# Patient Record
Sex: Female | Born: 1965 | Race: White | Hispanic: No | Marital: Married | Smoking: Never smoker
Health system: Southern US, Community
[De-identification: ages and names within clinical notes are randomized; demographics above are authoritative.]

## PROBLEM LIST (undated history)

## (undated) DIAGNOSIS — K219 Gastro-esophageal reflux disease without esophagitis: Secondary | ICD-10-CM

## (undated) DIAGNOSIS — M1990 Unspecified osteoarthritis, unspecified site: Secondary | ICD-10-CM

## (undated) HISTORY — PX: WISDOM TOOTH EXTRACTION: SHX21

## (undated) HISTORY — PX: OTHER SURGICAL HISTORY: SHX169

---

## 2000-11-02 ENCOUNTER — Other Ambulatory Visit: Admission: RE | Admit: 2000-11-02 | Discharge: 2000-11-02 | Payer: Self-pay | Admitting: Obstetrics and Gynecology

## 2001-03-01 ENCOUNTER — Encounter: Admission: RE | Admit: 2001-03-01 | Discharge: 2001-03-01 | Payer: Self-pay | Admitting: Obstetrics and Gynecology

## 2001-05-06 ENCOUNTER — Inpatient Hospital Stay (HOSPITAL_COMMUNITY): Admission: AD | Admit: 2001-05-06 | Discharge: 2001-05-08 | Payer: Self-pay | Admitting: Obstetrics and Gynecology

## 2001-05-10 ENCOUNTER — Encounter: Admission: RE | Admit: 2001-05-10 | Discharge: 2001-06-09 | Payer: Self-pay | Admitting: Obstetrics and Gynecology

## 2001-06-07 ENCOUNTER — Other Ambulatory Visit: Admission: RE | Admit: 2001-06-07 | Discharge: 2001-06-07 | Payer: Self-pay | Admitting: Obstetrics and Gynecology

## 2001-07-10 ENCOUNTER — Encounter: Admission: RE | Admit: 2001-07-10 | Discharge: 2001-08-09 | Payer: Self-pay | Admitting: Obstetrics and Gynecology

## 2001-09-09 ENCOUNTER — Encounter: Admission: RE | Admit: 2001-09-09 | Discharge: 2001-10-09 | Payer: Self-pay | Admitting: Obstetrics and Gynecology

## 2001-10-10 ENCOUNTER — Encounter: Admission: RE | Admit: 2001-10-10 | Discharge: 2001-11-09 | Payer: Self-pay | Admitting: Obstetrics and Gynecology

## 2018-01-08 ENCOUNTER — Other Ambulatory Visit: Payer: Self-pay

## 2018-01-08 ENCOUNTER — Emergency Department (HOSPITAL_COMMUNITY)
Admission: EM | Admit: 2018-01-08 | Discharge: 2018-01-08 | Disposition: A | Discharge status: Home / Self Care | Payer: 59 | Attending: Emergency Medicine | Admitting: Emergency Medicine

## 2018-01-08 ENCOUNTER — Other Ambulatory Visit: Payer: Self-pay | Admitting: Physician Assistant

## 2018-01-08 ENCOUNTER — Encounter (HOSPITAL_COMMUNITY): Payer: Self-pay

## 2018-01-08 ENCOUNTER — Emergency Department (HOSPITAL_COMMUNITY): Payer: 59

## 2018-01-08 DIAGNOSIS — R0789 Other chest pain: Secondary | ICD-10-CM | POA: Insufficient documentation

## 2018-01-08 DIAGNOSIS — R079 Chest pain, unspecified: Secondary | ICD-10-CM

## 2018-01-08 DIAGNOSIS — R55 Syncope and collapse: Secondary | ICD-10-CM | POA: Insufficient documentation

## 2018-01-08 LAB — CBC
HCT: 42.1 % (ref 36.0–46.0)
Hemoglobin: 13.5 g/dL (ref 12.0–15.0)
MCH: 29.2 pg (ref 26.0–34.0)
MCHC: 32.1 g/dL (ref 30.0–36.0)
MCV: 90.9 fL (ref 80.0–100.0)
Platelets: 226 10*3/uL (ref 150–400)
RBC: 4.63 MIL/uL (ref 3.87–5.11)
RDW: 13.2 % (ref 11.5–15.5)
WBC: 7.2 10*3/uL (ref 4.0–10.5)
nRBC: 0 % (ref 0.0–0.2)

## 2018-01-08 LAB — BASIC METABOLIC PANEL
Anion gap: 10 (ref 5–15)
BUN: 11 mg/dL (ref 6–20)
CO2: 23 mmol/L (ref 22–32)
Calcium: 9 mg/dL (ref 8.9–10.3)
Chloride: 103 mmol/L (ref 98–111)
Creatinine, Ser: 0.67 mg/dL (ref 0.44–1.00)
GFR calc Af Amer: >60 mL/min (ref >60)
GFR calc non Af Amer: >60 mL/min (ref >60)
Glucose, Bld: 147 mg/dL — ABNORMAL HIGH (ref 70–99)
Potassium: 4 mmol/L (ref 3.5–5.1)
Sodium: 136 mmol/L (ref 135–145)

## 2018-01-08 LAB — I-STAT TROPONIN, ED: Troponin i, poc: 0 ng/mL (ref 0.00–0.08)

## 2018-01-08 LAB — MAGNESIUM: Magnesium: 1.8 mg/dL (ref 1.7–2.4)

## 2018-01-08 MED ORDER — ASPIRIN 81 MG PO CHEW
324.0000 mg | CHEWABLE_TABLET | Freq: Once | ORAL | Status: AC
  Administered 2018-01-08: 324 mg via ORAL
  Filled 2018-01-08: qty 4

## 2018-01-08 NOTE — ED Triage Notes (Signed)
Pt c/o chest pain/fullness and passed out . Pain free now.

## 2018-01-08 NOTE — Discharge Instructions (Addendum)
You were evaluated in the emergency department for some chest pain that was associated with a syncopal or fainting episode.  You a test here that did not show an obvious cause of your symptoms.  We reviewed your symptoms and results with cardiology and they will be calling you to set you up with a follow-up appointment and a cardiac event monitor.  We recommend that you do not drive until this situation is better understood.  Please return if any concerns.

## 2018-01-08 NOTE — ED Provider Notes (Signed)
[facility] Provider Note   CSN: [id number] Arrival date & time: 01/08/18  1043     History   Chief Complaint Chief Complaint  Patient presents with  . Near Syncope    HPI Paula Silva is a 52 y.o. female.  She has no significant past medical history.  She is complaining of acute onset of multiple episodes of this uncomfortable feeling in the lower part of her chest that rises up through her chest.  She had a first episode happened this morning and she had made her feel lightheaded.  It improved and she had another episode while she was a passenger in her car with her husband.  He said she passed out for about a minute and then was back to normal.  No seizure-like activity no incontinence of bowel or bladder.  She had another episode while she was here in the ED as they were getting an EKG and was noted to have a PVC at that time.  It was not associated with syncope.  She is never had this before.  No recent illness symptoms.  Is only on some OTC meds.   The history is provided by the patient.  Chest Pain   This is a new problem. The current episode started 3 to 5 hours ago. The problem occurs hourly. The problem has not changed since onset.The pain is present in the substernal region. The pain is moderate. The quality of the pain is described as heavy and pressure-like. The pain radiates to the left neck and right neck. Duration of episode(s) is 1 minute. Associated symptoms include dizziness, malaise/fatigue, near-syncope and syncope. Pertinent negatives include no abdominal pain, no cough, no diaphoresis, no fever, no headaches, no leg pain, no lower extremity edema, no numbness and no shortness of breath. She has tried rest for the symptoms. The treatment provided moderate relief. There are no known risk factors.  Her family medical history is significant for CAD.    No past medical history on file.  There are no active problems to display  for this patient.   Past Surgical History:  Procedure Laterality Date  . cyst removed       OB History   None      Home Medications    Prior to Admission medications   Not on File    Family History No family history on file.  Social History Social History   Tobacco Use  . Smoking status: Not on file  Substance Use Topics  . Alcohol use: Not on file  . Drug use: Not on file     Allergies   Patient has no known allergies.   Review of Systems Review of Systems  Constitutional: Positive for malaise/fatigue. Negative for diaphoresis and fever.  HENT: Negative for sore throat.   Eyes: Negative for visual disturbance.  Respiratory: Negative for cough and shortness of breath.   Cardiovascular: Positive for chest pain, syncope and near-syncope.  Gastrointestinal: Negative for abdominal pain.  Genitourinary: Negative for dysuria.  Musculoskeletal: Negative for neck pain.  Skin: Negative for rash.  Neurological: Positive for dizziness and syncope. Negative for numbness and headaches.     Physical Exam Updated Vital Signs BP 119/85 (BP Location: Right Arm)   Pulse 77   Temp 97.6 F (36.4 C) (Oral)   Resp 20   SpO2 100%   Physical Exam  Constitutional: She appears well-developed and well-nourished. No distress.  HENT:  Head: Normocephalic and atraumatic.  Eyes: Conjunctivae are normal.  Neck: Neck supple.  Cardiovascular: Normal rate, regular rhythm and normal heart sounds.  No murmur heard. Pulmonary/Chest: Effort normal and breath sounds normal. No respiratory distress.  Abdominal: Soft. There is no tenderness.  Musculoskeletal: She exhibits no edema, tenderness or deformity.  Neurological: She is alert.  Skin: Skin is warm and dry. Capillary refill takes less than 2 seconds.  Psychiatric: She has a normal mood and affect.  Nursing note and vitals reviewed.    ED Treatments / Results  Labs (all labs ordered are listed, but only abnormal results  are displayed) Labs Reviewed  BASIC METABOLIC PANEL - Abnormal; Notable for the following components:      Result Value   Glucose, Bld 147 (*)    All other components within normal limits  CBC  MAGNESIUM  I-STAT TROPONIN, ED    EKG EKG Interpretation  Date/Time:  Sunday January 08 2018 11:05:17 EST Ventricular Rate:  68 PR Interval:    QRS Duration: 99 QT Interval:  408 QTC Calculation: 434 R Axis:   21 Text Interpretation:  Sinus rhythm nl intervals no acute st/ts Confirmed by Meridee Score [phone]) on 01/08/2018 11:16:06 AM   Radiology Dg Chest Port 1 View  Result Date: 01/08/2018 CLINICAL DATA:  Chest pain and syncopal episode. EXAM: PORTABLE CHEST 1 VIEW COMPARISON:  None. FINDINGS: The heart size and mediastinal contours are within normal limits. Both lungs are clear. The visualized skeletal structures are unremarkable. IMPRESSION: No active disease. Electronically Signed   By: Paulina Fusi M.D.   On: 01/08/2018 12:48    Procedures Procedures (including critical care time)  Medications Ordered in ED Medications - No data to display   Initial Impression / Assessment and Plan / ED Course  I have reviewed the triage vital signs and the nursing notes.  Pertinent labs & imaging results that were available during my care of the patient were reviewed by me and considered in my medical decision making (see chart for details).  Clinical Course as of Jan 09 929  Wynelle Link Jan 08, 2018  1235 Discussed with Dr. Purvis Sheffield from [facility] cardiology.  He felt that the patient could be safely followed up outpatient and he will arrange for her to get a cardiac monitor and schedule a follow-up appointment for her.   [MB]  1256 I reviewed the patient's lab work EKG and chest x-ray results with her.  We are sending her home and she understands not to drive and to expect some follow-up plan from the cardiology clinic.  She understands to return if any worsening symptoms.   [MB]    Clinical  Course User Index [MB] Terrilee Files, MD     Final Clinical Impressions(s) / ED Diagnoses   Final diagnoses:  Syncope and collapse  Nonspecific chest pain    ED Discharge Orders    None       Terrilee Files, MD 01/09/18 [phone]

## 2018-01-08 NOTE — ED Notes (Signed)
DC instructions discussed with pt and husband. Understanding voiced. Pt home stable via wc.

## 2018-01-09 ENCOUNTER — Encounter (HOSPITAL_COMMUNITY): Payer: Self-pay | Admitting: Emergency Medicine

## 2018-01-10 NOTE — Progress Notes (Signed)
Cardiology Office Note   Date:  01/11/2018   ID:  Paula NeptuneKimberly S Poche, DOB 07/13/1965, MRN [id number]  PCP:  Patient, No Pcp Per  Cardiologist:   Tyaira Heward [place]Jordan, MD   Chief Complaint  Patient presents with  . Near Syncope      History of Present Illness: Paula Silva is a 52 y.o. female who is seen at the request of Meridee ScoreMichael Butler MD in the ED for evaluation of syncope. In general she is in excellent health. This past Sunday she states she didn't feel well. She took a shower and afterwards she felt a sudden wave come over her and she felt like something horrible was going to happen to her. She felt sweaty. No nausea, vomiting, diarrhea or any pain. She went to lie down. After 10-15 minutes she felt better. She then went with her husband and [place]. Again the same symptoms came over her in a wave and she had a similar sensation of impending doom. Her husband noted her slumped over and pulled over. Noted she had gurgling respiration. Got her out and she regained consciousness but was confused - lasting even when she got to ED. No incontinence or seizure activity noted. ED evaluation was unremarkable. A single PVC noted. Ecg, CXR and labs normal. Since then these symptoms have not recurred but she has noted some mild chest tightness and fluttering sensation.     History reviewed. No pertinent past medical history.  Past Surgical History:  Procedure Laterality Date  . cyst removed    . lipoma removal       No current outpatient medications on file.   No current facility-administered medications for this visit.     Allergies:   Patient has no known allergies.    Social History:  The patient  reports that she has never smoked. She has never used smokeless tobacco. She reports that she drinks alcohol. She reports that she does not use drugs.   Family History:  The patient's family history includes Heart failure in her father;  Prostate cancer in her brother; Pulmonary embolism in her brother; Stroke in her mother.    ROS:  Please see the history of present illness.   Otherwise, review of systems are positive for none.   All other systems are reviewed and negative.    PHYSICAL EXAM: VS:  BP 124/78 (BP Location: Right Arm)   Pulse 78   Ht 5' 8.5" (1.74 m)   Wt 226 lb 6.4 oz (102.7 kg)   BMI 33.92 kg/m  , BMI Body mass index is 33.92 kg/m. GEN: Well nourished, overweight, in no acute distress  HEENT: normal  Neck: no JVD, carotid bruits, or masses Cardiac: RRR; no murmurs, rubs, or gallops,no edema  Respiratory:  clear to auscultation bilaterally, normal work of breathing GI: soft, nontender, nondistended, + BS MS: no deformity or atrophy  Skin: warm and dry, no rash Neuro:  Strength and sensation are intact Psych: euthymic mood, full affect   EKG:  EKG is not ordered today. The ekg ordered 01/08/18 demonstrates NSR with normal Ecg. I have personally reviewed and interpreted this study.    Recent Labs: 01/08/2018: BUN 11; Creatinine, Ser 0.67; Hemoglobin 13.5; Magnesium 1.8; Platelets 226; Potassium 4.0; Sodium 136    Lipid Panel No results found for: CHOL, TRIG, HDL, CHOLHDL, VLDL, LDLCALC, LDLDIRECT    Wt Readings from Last 3 Encounters:  01/11/18 226 lb 6.4  oz (102.7 kg)  01/08/18 240 lb (108.9 kg)      Other studies Reviewed: Additional studies/ records that were reviewed today include: none   ASSESSMENT AND PLAN:  1.  Syncope. Etiology unclear. Possibly vasovagal but no clear trigger. Other possibilities include arrhythmia or seizure. One PVC noted in ED. Will schedule for 30 day event monitor and Echocardiogram. If these studies are normal consider Neuro evaluation. I have recommended she not drive until evaluation complete.    Current medicines are reviewed at length with the patient today.  The patient does not have concerns regarding medicines.  The following changes have been  made:  no change  Labs/ tests ordered today include:   Orders Placed This Encounter  Procedures  . Cardiac event monitor  . ECHOCARDIOGRAM COMPLETE     Disposition:   FU with me in 6 weeks  Signed, Amiri Tritch [place], MD  01/11/2018 9:41 AM    [facility] [address] [place], [place], [address] Phone [phone], Fax [phone]

## 2018-01-11 ENCOUNTER — Ambulatory Visit (INDEPENDENT_AMBULATORY_CARE_PROVIDER_SITE_OTHER): Payer: Managed Care, Other (non HMO) | Admitting: Cardiology

## 2018-01-11 ENCOUNTER — Encounter: Payer: Self-pay | Admitting: Cardiology

## 2018-01-11 VITALS — BP 124/78 | HR 78 | Ht 68.5 in | Wt 226.4 lb

## 2018-01-11 DIAGNOSIS — R55 Syncope and collapse: Secondary | ICD-10-CM | POA: Diagnosis not present

## 2018-01-11 NOTE — Patient Instructions (Addendum)
Medication Instructions:  Continue same meds If you need a refill on your cardiac medications before your next appointment, please call your pharmacy.   Lab work: None ordered   Testing/Procedures: 30 day Event Monitor Echo  Follow-Up: At [facility], you and your health needs are our priority.  As part of our continuing mission to provide you with exceptional heart care, we have created designated Provider Care Teams.  These Care Teams include your primary Cardiologist (physician) and Advanced Practice Providers (APPs -  Physician Assistants and Nurse Practitioners) who all work together to provide you with the care you need, when you need it. Follow Up appointment with Dr.Jordan in 6 weeks

## 2018-01-13 ENCOUNTER — Other Ambulatory Visit: Payer: Self-pay

## 2018-01-13 ENCOUNTER — Ambulatory Visit (INDEPENDENT_AMBULATORY_CARE_PROVIDER_SITE_OTHER): Payer: Managed Care, Other (non HMO)

## 2018-01-13 ENCOUNTER — Ambulatory Visit (HOSPITAL_COMMUNITY): Payer: 59 | Attending: Cardiology

## 2018-01-13 DIAGNOSIS — R55 Syncope and collapse: Secondary | ICD-10-CM | POA: Diagnosis not present

## 2018-01-13 MED ORDER — PERFLUTREN LIPID MICROSPHERE
1.0000 mL | INTRAVENOUS | Status: AC | PRN
  Administered 2018-01-13: 2 mL via INTRAVENOUS

## 2018-02-20 ENCOUNTER — Encounter: Payer: Self-pay | Admitting: Neurology

## 2018-02-20 ENCOUNTER — Telehealth: Payer: Self-pay

## 2018-02-20 NOTE — Telephone Encounter (Signed)
Pt aware of results; informed pt of Dr. SwazilandJordan recommendation for referral to neurology and that she would be contacted to set up appt. Pt verbalized understanding. Order for referral in system. Will route to [facility] Scheduling to follow

## 2018-02-20 NOTE — Addendum Note (Signed)
Addended by: Harlow Asa on: 02/20/2018 03:43 PM   Modules accepted: Orders

## 2018-03-08 NOTE — Progress Notes (Deleted)
Cardiology Office Note   Date:  03/08/2018   ID:  Paula Silva, DOB 1965-09-04, MRN [id number]  PCP:  Patient, No Pcp Per  Cardiologist:   Peter SwazilandJordan, MD   No chief complaint on file.     History of Present Illness: Paula Silva is a 53 y.o. female who is seen for follow up syncope. In general she is in excellent health. She was seen in December for evaluation of syncope.  She took a shower and afterwards she felt a sudden wave come over her and she felt like something horrible was going to happen to her. She felt sweaty. No nausea, vomiting, diarrhea or any pain. She went to lie down. After 10-15 minutes she felt better. She then went with her husband and [place]. Again the same symptoms came over her in a wave and she had a similar sensation of impending doom. Her husband noted her slumped over and pulled over. Noted she had gurgling respiration. Got her out and she regained consciousness but was confused - lasting even when she got to ED. No incontinence or seizure activity noted. ED evaluation was unremarkable. A single PVC noted. Ecg, CXR and labs normal. Since then these symptoms have not recurred but she has noted some mild chest tightness and fluttering sensation.   We performed an Echo which was normal. An Event monitor was placed and showed a single 11 beat run of accelerated idioventricular rhythm rate 109. No associated symptoms. No other arrhythmia.     No past medical history on file.  Past Surgical History:  Procedure Laterality Date  . cyst removed    . lipoma removal       No current outpatient medications on file.   No current facility-administered medications for this visit.     Allergies:   Patient has no known allergies.    Social History:  The patient  reports that she has never smoked. She has never used smokeless tobacco. She reports current alcohol use. She reports that she does not use  drugs.   Family History:  The patient's family history includes Heart failure in her father; Prostate cancer in her brother; Pulmonary embolism in her brother; Stroke in her mother.    ROS:  Please see the history of present illness.   Otherwise, review of systems are positive for none.   All other systems are reviewed and negative.    PHYSICAL EXAM: VS:  There were no vitals taken for this visit. , BMI There is no height or weight on file to calculate BMI. GEN: Well nourished, overweight, in no acute distress  HEENT: normal  Neck: no JVD, carotid bruits, or masses Cardiac: RRR; no murmurs, rubs, or gallops,no edema  Respiratory:  clear to auscultation bilaterally, normal work of breathing GI: soft, nontender, nondistended, + BS MS: no deformity or atrophy  Skin: warm and dry, no rash Neuro:  Strength and sensation are intact Psych: euthymic mood, full affect   EKG:  EKG is not ordered today. The ekg ordered 01/08/18 demonstrates NSR with normal Ecg. I have personally reviewed and interpreted this study.    Recent Labs: 01/08/2018: BUN 11; Creatinine, Ser 0.67; Hemoglobin 13.5; Magnesium 1.8; Platelets 226; Potassium 4.0; Sodium 136    Lipid Panel No results found for: CHOL, TRIG, HDL, CHOLHDL, VLDL, LDLCALC, LDLDIRECT    Wt Readings from Last 3 Encounters:  01/11/18 226 lb 6.4 oz (102.7 kg)  01/08/18 240 lb (108.9 kg)      Other studies Reviewed: Additional studies/ records that were reviewed today include:   Echo 01/13/18: Study Conclusions  - Left ventricle: The cavity size was normal. Systolic function was   normal. The estimated ejection fraction was in the range of 60%   to 65%. Wall motion was normal; there were no regional wall   motion abnormalities. Left ventricular diastolic function   parameters were normal. - Left atrium: The atrium was mildly dilated.  Event monitor: Study Highlights    Normal sinus rhythm  11 beat run of accelerated junctional  versus idioventricular rhythm on 01/18/18. no symptoms noted.  No other arrhythmia noted      ASSESSMENT AND PLAN:  1.  Syncope. Etiology unclear. Possibly vasovagal but no clear trigger. Other possibilities include arrhythmia or seizure. One PVC noted in ED. Will schedule for 30 day event monitor and Echocardiogram. If these studies are normal consider Neuro evaluation. I have recommended she not drive until evaluation complete.    Current medicines are reviewed at length with the patient today.  The patient does not have concerns regarding medicines.  The following changes have been made:  no change  Labs/ tests ordered today include:   No orders of the defined types were placed in this encounter.    Disposition:   FU with me in 6 weeks  Signed, Peter Swazil[place]Jordan, MD  03/08/2018 7:43 AM    [facility] [address], [place], [place], [address] Phone [phone], Fax [phone]

## 2018-03-13 ENCOUNTER — Ambulatory Visit: Payer: Managed Care, Other (non HMO) | Admitting: Cardiology

## 2018-03-14 ENCOUNTER — Encounter: Payer: Self-pay | Admitting: *Deleted

## 2018-05-01 ENCOUNTER — Ambulatory Visit: Payer: Managed Care, Other (non HMO) | Admitting: Neurology

## 2018-05-22 ENCOUNTER — Encounter: Payer: Self-pay | Admitting: Neurology

## 2019-04-06 ENCOUNTER — Other Ambulatory Visit: Payer: Self-pay

## 2019-04-06 ENCOUNTER — Ambulatory Visit: Payer: Managed Care, Other (non HMO) | Attending: Internal Medicine

## 2019-04-06 DIAGNOSIS — Z23 Encounter for immunization: Secondary | ICD-10-CM | POA: Insufficient documentation

## 2019-04-06 NOTE — Progress Notes (Signed)
   Covid-19 Vaccination Clinic  Name:  Paula Silva    MRN: [id number] DOB: 25-Sep-1965  04/06/2019  Paula Silva was observed post Covid-19 immunization for 15 minutes without incident. She was provided with Vaccine Information Sheet and instruction to access the V-Safe system.   Paula Silva was instructed to call 911 with any severe reactions post vaccine: Marland Kitchen Difficulty breathing  . Swelling of face and throat  . A fast heartbeat  . A bad rash all over body  . Dizziness and weakness

## 2019-05-02 ENCOUNTER — Ambulatory Visit: Payer: 59 | Attending: Internal Medicine

## 2019-05-02 DIAGNOSIS — Z23 Encounter for immunization: Secondary | ICD-10-CM

## 2019-05-02 NOTE — Progress Notes (Signed)
   Covid-19 Vaccination Clinic  Name:  Paula Silva    MRN: [id number] DOB: [birth year]  05/02/2019  [name] was observed post Covid-19 immunization for 15 minutes without incident. She was provided with Vaccine Information Sheet and instruction to access the V-Safe system.   [name] was instructed to call 911 with any severe reactions post vaccine: Marland Kitchen Difficulty breathing  . Swelling of face and throat  . A fast heartbeat  . A bad rash all over body  . Dizziness and weakness   Immunizations Administered    Name Date Dose VIS Date Route   Pfizer COVID-19 Vaccine 05/02/2019 12:40 PM 0.3 mL 01/12/2019 Intramuscular   Manufacturer: [employer], [employer]   Lot: [id number]   NDC: [address]-0

## 2019-12-07 ENCOUNTER — Other Ambulatory Visit: Payer: Self-pay | Admitting: Internal Medicine

## 2019-12-07 DIAGNOSIS — R1013 Epigastric pain: Secondary | ICD-10-CM

## 2019-12-19 ENCOUNTER — Ambulatory Visit
Admission: RE | Admit: 2019-12-19 | Discharge: 2019-12-19 | Disposition: A | Discharge status: Home / Self Care | Payer: 59 | Source: Ambulatory Visit | Attending: Internal Medicine | Admitting: Internal Medicine

## 2019-12-19 DIAGNOSIS — R1013 Epigastric pain: Secondary | ICD-10-CM

## 2019-12-21 ENCOUNTER — Other Ambulatory Visit: Payer: Self-pay | Admitting: Internal Medicine

## 2019-12-21 DIAGNOSIS — Z0000 Encounter for general adult medical examination without abnormal findings: Secondary | ICD-10-CM

## 2020-01-28 ENCOUNTER — Ambulatory Visit: Payer: Self-pay | Admitting: Surgery

## 2020-01-28 NOTE — H&P (Signed)
History of Present Illness (Avarey Yaeger L. Freida Busman MD; 01/28/2020 2:02 PM) The patient is a 54 year old female who presents for evaluation of gall stones.Ms. Perin is a 54 yo female who was referred for evaluation of gallstones. Several weeks ago she had an episode of RUQ and epigastric pain that lasted for a few days. It was associated with nausea and diarrhea. A RUQ [place] on 12/19/19 showed a 1.8cm gallstone in the neck of the gallbladder, as well as 2-3 subcentimeter polyps. LFTs, amylase and lipase on 12/05/19 were normal. She was referred for surgical evaluation. She has been avoiding greasy and fatty foods but still has some RUQ discomfort after eating.  PMH: none PSH: lipoma excision from forehead FHx: DM (father), heart disease (mother) SocHx: never smoker, occasional EtOH   Diagnostic Studies History Jaynee Eagles, [place]; 01/28/2020 1:43 PM) Colonoscopy  never Mammogram  never Pap Smear  >5 years ago  Allergies Jaynee Eagles, CMA; 01/28/2020 1:43 PM) No Known Drug Allergies  [01/28/2020]: Allergies Reconciled   Medication History Jaynee Eagles, CMA; 01/28/2020 1:43 PM) No Current Medications Medications Reconciled  Social History Jaynee Eagles, [place]; 01/28/2020 1:43 PM) Caffeine use  Carbonated beverages, Tea. No alcohol use  No drug use  Tobacco use  Never smoker.  Family History Jaynee Eagles, [place]; 01/28/2020 1:43 PM) Cerebrovascular Accident  Mother. Diabetes Mellitus  Father. Heart Disease  Father. Melanoma  Brother. Respiratory Condition  Mother.  Pregnancy / Birth History Jaynee Eagles, [place]; 01/28/2020 1:43 PM) Rhett Bannister  2 Maternal age  87-30 Para  2  Other Problems Jaynee Eagles, [place]; 01/28/2020 1:43 PM) Cholelithiasis  Gastroesophageal Reflux Disease     Review of Systems Jaynee Eagles CMA; 01/28/2020 1:43 PM) General Not Present- Appetite Loss, Chills, Fatigue, Fever, Night Sweats, Weight Gain and Weight Loss. Skin Not  Present- Change in Wart/Mole, Dryness, Hives, Jaundice, New Lesions, Non-Healing Wounds, Rash and Ulcer. HEENT Present- Seasonal Allergies and Sinus Pain. Not Present- Earache, Hearing Loss, Hoarseness, Nose Bleed, Oral Ulcers, Ringing in the Ears, Sore Throat, Visual Disturbances, Wears glasses/contact lenses and Yellow Eyes. Respiratory Not Present- Bloody sputum, Chronic Cough, Difficulty Breathing, Snoring and Wheezing. Breast Not Present- Breast Mass, Breast Pain, Nipple Discharge and Skin Changes. Cardiovascular Not Present- Chest Pain, Difficulty Breathing Lying Down, Leg Cramps, Palpitations, Rapid Heart Rate, Shortness of Breath and Swelling of Extremities. Gastrointestinal Present- Abdominal Pain. Not Present- Bloating, Bloody Stool, Change in Bowel Habits, Chronic diarrhea, Constipation, Difficulty Swallowing, Excessive gas, Gets full quickly at meals, Hemorrhoids, Indigestion, Nausea, Rectal Pain and Vomiting. Musculoskeletal Present- Joint Pain, Joint Stiffness and Swelling of Extremities. Not Present- Back Pain, Muscle Pain and Muscle Weakness. Neurological Not Present- Decreased Memory, Fainting, Headaches, Numbness, Seizures, Tingling, Tremor, Trouble walking and Weakness. Psychiatric Not Present- Anxiety, Bipolar, Change in Sleep Pattern, Depression, Fearful and Frequent crying. Endocrine Not Present- Cold Intolerance, Excessive Hunger, Hair Changes, Heat Intolerance, Hot flashes and New Diabetes. Hematology Not Present- Blood Thinners, Easy Bruising, Excessive bleeding, Gland problems, HIV and Persistent Infections.  Vitals Jaynee Eagles CMA; 01/28/2020 1:43 PM) 01/28/2020 1:43 PM Weight: 227.25 lb Height: 68in Body Surface Area: 2.16 m Body Mass Index: 34.55 kg/m  Temp.: 98.77F  Pulse: 89 (Regular)  P.OX: 99% (Room air) BP: 132/88(Sitting, Left Arm, Standard)       Physical Exam (Keshona Kartes L. Freida Busman MD; 01/28/2020 2:03 PM) The physical exam findings are as  follows: Note: Constitutional: No acute distress; conversant; no deformities Neuro: alert and oriented; cranial nerves grossly in tact; no focal deficits Eyes: Moist  conjunctiva; anicteric sclerae; extraocular movements in tact Neck: Trachea midline Lungs: Normal respiratory effort; lungs clear to auscultation bilaterally; symmetric chest wall expansion CV: Regular rate and rhythm; no murmurs; no pitting edema GI: Abdomen soft, nontender, nondistended; no masses or organomegaly, no surgical scars MSK: Normal gait and station; no clubbing/cyanosis Psychiatric: Appropriate affect; alert and oriented 3    Assessment & Plan (Thaddus Mcdowell L. Freida Busman MD; 01/28/2020 2:04 PM) SYMPTOMATIC CHOLELITHIASIS (K80.20) Impression: 54 yo female presenting with RUQ abdominal pain. I reviewed her referral notes and RUQ [place] - there is a large stone in the neck of the gallbladder with shadowing. No wall thickening or pericholecystic fluid. Her symptoms are consistent with biliary colic. I recommended laparoscopic cholecystectomy. The details of the procedure were discussed, including the benefits and risks of bleeding, infection, and <0.5% risk of common bile duct injury. The patient agrees to proceed with surgery. She is otherwise in good health. Outside LFTs were unremarkable. Will schedule electively.  Sophronia Simas, MD [facility] Surgery General, Hepatobiliary and Pancreatic Surgery 01/28/20 2:07 PM

## 2020-03-04 NOTE — Patient Instructions (Addendum)
DUE TO COVID-19 ONLY ONE VISITOR IS ALLOWED TO COME WITH YOU AND STAY IN THE WAITING ROOM ONLY DURING PRE OP AND PROCEDURE DAY OF SURGERY. THE 1 VISITOR  MAY VISIT WITH YOU AFTER SURGERY IN YOUR PRIVATE ROOM DURING VISITING HOURS ONLY!  YOU NEED TO HAVE A COVID 19 TEST ON__2/7_____ @_9 :15______, THIS TEST MUST BE DONE BEFORE SURGERY,  COVID TESTING SITE [address] [place] , IT IS ON THE RIGHT GOING OUT [address] APPROXIMATELY  2 MINUTES PAST [employer] ON THE RIGHT. ONCE YOUR COVID TEST IS COMPLETED,  PLEASE BEGIN THE QUARANTINE INSTRUCTIONS AS OUTLINED IN YOUR HANDOUT.                [address]    Your procedure is scheduled on:03/13/20    Report to [facility] Main  Entrance   Report to admitting at  7:00 AM     Call this number if you have problems the morning of surgery [phone]    Remember: Do not eat food or drink liquids :After Midnight.   BRUSH YOUR TEETH MORNING OF SURGERY AND RINSE YOUR MOUTH OUT, NO CHEWING GUM CANDY OR MINTS.     Take these medicines the morning of surgery with A SIP OF WATER: none                                 You may not have any metal on your body including hair pins and              piercings  Do not wear jewelry, make-up, lotions, powders or perfumes, deodorant             Do not wear nail polish on your fingernails.  Do not shave  48 hours prior to surgery.     Do not bring valuables to the hospital. [facility] IS NOT             RESPONSIBLE   FOR VALUABLES.  Contacts, dentures or bridgework may not be worn into surgery.  L     Patients discharged the day of surgery will not be allowed to drive home.   IF YOU ARE HAVING SURGERY AND GOING HOME THE SAME DAY, YOU MUST HAVE AN ADULT TO DRIVE YOU HOME AND BE WITH YOU FOR 24 HOURS.  YOU MAY GO HOME BY TAXI OR UBER OR ORTHERWISE, BUT AN ADULT MUST ACCOMPANY YOU HOME AND STAY WITH YOU FOR 24 HOURS.  Name and phone number of your  driver:  Special Instructions: N/A              Please read over the following fact sheets you were given: _____________________________________________________________________             [facility] - Preparing for Surgery Before surgery, you can play an important role.  Because skin is not sterile, your skin needs to be as free of germs as possible.  You can reduce the number of germs on your skin by washing with CHG (chlorahexidine gluconate) soap before surgery.  CHG is an antiseptic cleaner which kills germs and bonds with the skin to continue killing germs even after washing. Please DO NOT use if you have an allergy to CHG or antibacterial soaps.  If your skin becomes reddened/irritated stop using the CHG and inform your nurse when you arrive at Short Stay. Do not shave (including legs and underarms)  for at least 48 hours prior to the first CHG shower. Please follow these instructions carefully:  1.  Shower with CHG Soap the night before surgery and the  morning of Surgery.  2.  If you choose to wash your hair, wash your hair first as usual with your  normal  shampoo.  3.  After you shampoo, rinse your hair and body thoroughly to remove the  shampoo.                                        4.  Use CHG as you would any other liquid soap.  You can apply chg directly  to the skin and wash                       Gently with a scrungie or clean washcloth.  5.  Apply the CHG Soap to your body ONLY FROM THE NECK DOWN.   Do not use on face/ open                           Wound or open sores. Avoid contact with eyes, ears mouth and genitals (private parts).                       Wash face,  Genitals (private parts) with your normal soap.             6.  Wash thoroughly, paying special attention to the area where your surgery  will be performed.  7.  Thoroughly rinse your body with warm water from the neck down.  8.  DO NOT shower/wash with your normal soap after using and rinsing off  the CHG  Soap.                9.  Pat yourself dry with a clean towel.            10.  Wear clean pajamas.            11.  Place clean sheets on your bed the night of your first shower and do not  sleep with pets. Day of Surgery : Do not apply any lotions/deodorants the morning of surgery.  Please wear clean clothes to the hospital/surgery center.  FAILURE TO FOLLOW THESE INSTRUCTIONS MAY RESULT IN THE CANCELLATION OF YOUR SURGERY PATIENT SIGNATURE_________________________________  NURSE SIGNATURE__________________________________  ________________________________________________________________________

## 2020-03-06 ENCOUNTER — Other Ambulatory Visit: Payer: Self-pay

## 2020-03-06 ENCOUNTER — Encounter (HOSPITAL_COMMUNITY)
Admission: RE | Admit: 2020-03-06 | Discharge: 2020-03-06 | Disposition: A | Discharge status: Home / Self Care | Payer: 59 | Source: Ambulatory Visit | Attending: Surgery | Admitting: Surgery

## 2020-03-06 ENCOUNTER — Encounter (HOSPITAL_COMMUNITY): Payer: Self-pay

## 2020-03-06 DIAGNOSIS — Z01812 Encounter for preprocedural laboratory examination: Secondary | ICD-10-CM | POA: Insufficient documentation

## 2020-03-06 HISTORY — DX: Gastro-esophageal reflux disease without esophagitis: K21.9

## 2020-03-06 HISTORY — DX: Unspecified osteoarthritis, unspecified site: M19.90

## 2020-03-06 LAB — CBC
HCT: 42.2 % (ref 36.0–46.0)
Hemoglobin: 13.7 g/dL (ref 12.0–15.0)
MCH: 29.7 pg (ref 26.0–34.0)
MCHC: 32.5 g/dL (ref 30.0–36.0)
MCV: 91.3 fL (ref 80.0–100.0)
Platelets: 240 10*3/uL (ref 150–400)
RBC: 4.62 MIL/uL (ref 3.87–5.11)
RDW: 13.3 % (ref 11.5–15.5)
WBC: 7 10*3/uL (ref 4.0–10.5)
nRBC: 0 % (ref 0.0–0.2)

## 2020-03-06 NOTE — Progress Notes (Signed)
COVID Vaccine Completed:yes Date COVID Vaccine completed:07/2019-booster  12/02/20 COVID vaccine manufacturer: Pfizer    Moderna   [employer]   PCP - Dr. Nils Flack Cardiologist - none  Chest x-ray - no EKG - no Stress Test - no ECHO - 2019- work up for syncopy Cardiac Cath - no Pacemaker/ICD device last checked:na  Sleep Study - No CPAP -   Fasting Blood Sugar - NA Checks Blood Sugar _____ times a day  Blood Thinner Instructions:no Aspirin Instructions: Last Dose:  Anesthesia review:   Patient denies shortness of breath, fever, cough and chest pain at PAT appointment yes  Patient verbalized understanding of instructions that were given to them at the PAT appointment. Patient was also instructed that they will need to review over the PAT instructions again at home before surgery. Yes Pt has no SOB with activities. Her last syncope episode was in 2021.

## 2020-03-10 ENCOUNTER — Other Ambulatory Visit (HOSPITAL_COMMUNITY)
Admission: RE | Admit: 2020-03-10 | Discharge: 2020-03-10 | Disposition: A | Discharge status: Home / Self Care | Payer: 59 | Source: Ambulatory Visit | Attending: Surgery | Admitting: Surgery

## 2020-03-10 DIAGNOSIS — Z20822 Contact with and (suspected) exposure to covid-19: Secondary | ICD-10-CM | POA: Diagnosis not present

## 2020-03-10 DIAGNOSIS — Z01812 Encounter for preprocedural laboratory examination: Secondary | ICD-10-CM | POA: Insufficient documentation

## 2020-03-10 LAB — SARS CORONAVIRUS 2 (TAT 6-24 HRS): SARS Coronavirus 2: NEGATIVE

## 2020-03-13 ENCOUNTER — Ambulatory Visit (HOSPITAL_COMMUNITY): Payer: 59 | Admitting: Anesthesiology

## 2020-03-13 ENCOUNTER — Encounter (HOSPITAL_COMMUNITY)
Admission: RE | Disposition: A | Discharge status: Home / Self Care | Payer: Self-pay | Source: Ambulatory Visit | Attending: Surgery

## 2020-03-13 ENCOUNTER — Encounter (HOSPITAL_COMMUNITY): Payer: Self-pay | Admitting: Surgery

## 2020-03-13 ENCOUNTER — Ambulatory Visit (HOSPITAL_COMMUNITY)
Admission: RE | Admit: 2020-03-13 | Discharge: 2020-03-13 | Disposition: A | Discharge status: Home / Self Care | Payer: 59 | Source: Ambulatory Visit | Attending: Surgery | Admitting: Surgery

## 2020-03-13 DIAGNOSIS — Z79899 Other long term (current) drug therapy: Secondary | ICD-10-CM | POA: Insufficient documentation

## 2020-03-13 DIAGNOSIS — K8012 Calculus of gallbladder with acute and chronic cholecystitis without obstruction: Secondary | ICD-10-CM | POA: Diagnosis not present

## 2020-03-13 DIAGNOSIS — K8020 Calculus of gallbladder without cholecystitis without obstruction: Secondary | ICD-10-CM | POA: Diagnosis present

## 2020-03-13 HISTORY — PX: CHOLECYSTECTOMY: SHX55

## 2020-03-13 LAB — PREGNANCY, URINE: Preg Test, Ur: NEGATIVE

## 2020-03-13 SURGERY — LAPAROSCOPIC CHOLECYSTECTOMY
Anesthesia: General

## 2020-03-13 MED ORDER — DEXAMETHASONE SODIUM PHOSPHATE 10 MG/ML IJ SOLN
INTRAMUSCULAR | Status: AC
  Filled 2020-03-13: qty 1

## 2020-03-13 MED ORDER — ONDANSETRON HCL 4 MG/2ML IJ SOLN: 4.0000 mg | Freq: Once | INTRAMUSCULAR | Status: DC | PRN

## 2020-03-13 MED ORDER — FENTANYL CITRATE (PF) 100 MCG/2ML IJ SOLN
INTRAMUSCULAR | Status: AC
  Filled 2020-03-13: qty 2

## 2020-03-13 MED ORDER — OXYCODONE HCL 5 MG PO TABS
ORAL_TABLET | ORAL | Status: AC
  Filled 2020-03-13: qty 1

## 2020-03-13 MED ORDER — OXYCODONE HCL 5 MG PO TABS: 5.0000 mg | ORAL_TABLET | Freq: Four times a day (QID) | ORAL | 0 refills | Status: AC | PRN

## 2020-03-13 MED ORDER — MIDAZOLAM HCL 2 MG/2ML IJ SOLN
INTRAMUSCULAR | Status: AC
  Filled 2020-03-13: qty 2

## 2020-03-13 MED ORDER — ORAL CARE MOUTH RINSE: 15.0000 mL | Freq: Once | OROMUCOSAL | Status: AC

## 2020-03-13 MED ORDER — OXYCODONE HCL 5 MG PO TABS
5.0000 mg | ORAL_TABLET | Freq: Once | ORAL | Status: AC
  Administered 2020-03-13: 5 mg via ORAL

## 2020-03-13 MED ORDER — GABAPENTIN 300 MG PO CAPS
300.0000 mg | ORAL_CAPSULE | ORAL | Status: AC
  Administered 2020-03-13: 300 mg via ORAL
  Filled 2020-03-13: qty 1

## 2020-03-13 MED ORDER — 0.9 % SODIUM CHLORIDE (POUR BTL) OPTIME
TOPICAL | Status: DC | PRN
  Administered 2020-03-13: 1000 mL

## 2020-03-13 MED ORDER — FENTANYL CITRATE (PF) 100 MCG/2ML IJ SOLN
25.0000 ug | INTRAMUSCULAR | Status: DC | PRN
  Administered 2020-03-13: 25 ug via INTRAVENOUS
  Administered 2020-03-13: 50 ug via INTRAVENOUS

## 2020-03-13 MED ORDER — LACTATED RINGERS IV SOLN: INTRAVENOUS | Status: DC

## 2020-03-13 MED ORDER — MEPERIDINE HCL 50 MG/ML IJ SOLN: 6.2500 mg | INTRAMUSCULAR | Status: DC | PRN

## 2020-03-13 MED ORDER — CEFAZOLIN SODIUM-DEXTROSE 2-4 GM/100ML-% IV SOLN
2.0000 g | INTRAVENOUS | Status: AC
  Administered 2020-03-13: 2 g via INTRAVENOUS
  Filled 2020-03-13: qty 100

## 2020-03-13 MED ORDER — KETAMINE HCL 10 MG/ML IJ SOLN
INTRAMUSCULAR | Status: DC | PRN
  Administered 2020-03-13: 35 mg via INTRAVENOUS

## 2020-03-13 MED ORDER — OXYCODONE HCL 5 MG PO TABS
5.0000 mg | ORAL_TABLET | Freq: Once | ORAL | Status: AC | PRN
  Administered 2020-03-13: 5 mg via ORAL

## 2020-03-13 MED ORDER — LIDOCAINE 2% (20 MG/ML) 5 ML SYRINGE
INTRAMUSCULAR | Status: DC | PRN
  Administered 2020-03-13: 80 mg via INTRAVENOUS

## 2020-03-13 MED ORDER — OXYCODONE HCL 5 MG/5ML PO SOLN
5.0000 mg | Freq: Once | ORAL | Status: AC | PRN
Start: 2020-03-13 — End: 2020-03-13

## 2020-03-13 MED ORDER — ROCURONIUM BROMIDE 10 MG/ML (PF) SYRINGE
PREFILLED_SYRINGE | INTRAVENOUS | Status: AC
  Filled 2020-03-13: qty 10

## 2020-03-13 MED ORDER — BUPIVACAINE-EPINEPHRINE (PF) 0.25% -1:200000 IJ SOLN
INTRAMUSCULAR | Status: AC
  Filled 2020-03-13: qty 30

## 2020-03-13 MED ORDER — ACETAMINOPHEN 325 MG PO TABS
ORAL_TABLET | ORAL | Status: AC
  Filled 2020-03-13: qty 2

## 2020-03-13 MED ORDER — FENTANYL CITRATE (PF) 100 MCG/2ML IJ SOLN
INTRAMUSCULAR | Status: DC | PRN
  Administered 2020-03-13: 100 ug via INTRAVENOUS

## 2020-03-13 MED ORDER — ONDANSETRON HCL 4 MG/2ML IJ SOLN
INTRAMUSCULAR | Status: AC
  Filled 2020-03-13: qty 2

## 2020-03-13 MED ORDER — CHLORHEXIDINE GLUCONATE 0.12 % MT SOLN
15.0000 mL | Freq: Once | OROMUCOSAL | Status: AC
  Administered 2020-03-13: 15 mL via OROMUCOSAL

## 2020-03-13 MED ORDER — DEXAMETHASONE SODIUM PHOSPHATE 10 MG/ML IJ SOLN
INTRAMUSCULAR | Status: DC | PRN
  Administered 2020-03-13: 10 mg via INTRAVENOUS

## 2020-03-13 MED ORDER — SUGAMMADEX SODIUM 200 MG/2ML IV SOLN
INTRAVENOUS | Status: DC | PRN
  Administered 2020-03-13: 200 mg via INTRAVENOUS

## 2020-03-13 MED ORDER — PROPOFOL 10 MG/ML IV BOLUS
INTRAVENOUS | Status: AC
  Filled 2020-03-13: qty 20

## 2020-03-13 MED ORDER — ROCURONIUM BROMIDE 10 MG/ML (PF) SYRINGE
PREFILLED_SYRINGE | INTRAVENOUS | Status: DC | PRN
  Administered 2020-03-13: 10 mg via INTRAVENOUS
  Administered 2020-03-13: 50 mg via INTRAVENOUS

## 2020-03-13 MED ORDER — ACETAMINOPHEN 325 MG PO TABS
325.0000 mg | ORAL_TABLET | ORAL | Status: DC | PRN
  Administered 2020-03-13: 650 mg via ORAL

## 2020-03-13 MED ORDER — ACETAMINOPHEN 500 MG PO TABS
1000.0000 mg | ORAL_TABLET | ORAL | Status: AC
  Administered 2020-03-13: 1000 mg via ORAL
  Filled 2020-03-13: qty 2

## 2020-03-13 MED ORDER — ACETAMINOPHEN 160 MG/5ML PO SOLN: 325.0000 mg | ORAL | Status: DC | PRN

## 2020-03-13 MED ORDER — LIDOCAINE 20MG/ML (2%) 15 ML SYRINGE OPTIME
INTRAMUSCULAR | Status: DC | PRN
  Administered 2020-03-13: 1.5 mg/kg/h via INTRAVENOUS

## 2020-03-13 MED ORDER — LIDOCAINE HCL 2 % IJ SOLN
INTRAMUSCULAR | Status: AC
  Filled 2020-03-13: qty 20

## 2020-03-13 MED ORDER — PROPOFOL 10 MG/ML IV BOLUS
INTRAVENOUS | Status: DC | PRN
  Administered 2020-03-13: 180 mg via INTRAVENOUS

## 2020-03-13 MED ORDER — ONDANSETRON HCL 4 MG/2ML IJ SOLN
INTRAMUSCULAR | Status: DC | PRN
  Administered 2020-03-13: 4 mg via INTRAVENOUS

## 2020-03-13 MED ORDER — MIDAZOLAM HCL 5 MG/5ML IJ SOLN
INTRAMUSCULAR | Status: DC | PRN
  Administered 2020-03-13: 2 mg via INTRAVENOUS

## 2020-03-13 SURGICAL SUPPLY — 62 items
ADH SKN CLS APL DERMABOND .7 (GAUZE/BANDAGES/DRESSINGS) ×1
APL PRP STRL LF DISP 70% ISPRP (MISCELLANEOUS) ×1
APL SWBSTK 6 STRL LF DISP (MISCELLANEOUS)
APPLICATOR COTTON TIP 6 STRL (MISCELLANEOUS) IMPLANT
APPLICATOR COTTON TIP 6IN STRL (MISCELLANEOUS)
APPLIER CLIP 5 13 M/L LIGAMAX5 (MISCELLANEOUS) ×2
APPLIER CLIP ROT 10 11.4 M/L (STAPLE) ×2
APR CLP MED LRG 11.4X10 (STAPLE) ×1
APR CLP MED LRG 5 ANG JAW (MISCELLANEOUS) ×1
BAG SPEC RTRVL LRG 6X4 10 (ENDOMECHANICALS) ×1
BLADE SURG 15 STRL LF DISP TIS (BLADE) ×1 IMPLANT
BLADE SURG 15 STRL SS (BLADE) ×2
BLADE SURG SZ11 CARB STEEL (BLADE) ×2 IMPLANT
CABLE HIGH FREQUENCY MONO STRZ (ELECTRODE) IMPLANT
CHLORAPREP W/TINT 26 (MISCELLANEOUS) ×2 IMPLANT
CLIP APPLIE 5 13 M/L LIGAMAX5 (MISCELLANEOUS) ×1 IMPLANT
CLIP APPLIE ROT 10 11.4 M/L (STAPLE) IMPLANT
COVER MAYO STAND STRL (DRAPES) IMPLANT
COVER SURGICAL LIGHT HANDLE (MISCELLANEOUS) ×2 IMPLANT
COVER WAND RF STERILE (DRAPES) IMPLANT
DECANTER SPIKE VIAL GLASS SM (MISCELLANEOUS) ×2 IMPLANT
DERMABOND ADVANCED (GAUZE/BANDAGES/DRESSINGS) ×1
DERMABOND ADVANCED .7 DNX12 (GAUZE/BANDAGES/DRESSINGS) ×1 IMPLANT
DRAPE C-ARM 42X120 X-RAY (DRAPES) IMPLANT
DRAPE UTILITY XL STRL (DRAPES) ×2 IMPLANT
ELECT L-HOOK LAP 45CM DISP (ELECTROSURGICAL)
ELECT PENCIL ROCKER SW 15FT (MISCELLANEOUS) ×2 IMPLANT
ELECT REM PT RETURN 15FT ADLT (MISCELLANEOUS) ×2 IMPLANT
ELECTRODE L-HOOK LAP 45CM DISP (ELECTROSURGICAL) IMPLANT
GAUZE 4X4 16PLY RFD (DISPOSABLE) ×2 IMPLANT
GLOVE BIOGEL PI IND STRL 6 (GLOVE) ×1 IMPLANT
GLOVE BIOGEL PI INDICATOR 6 (GLOVE) ×1
GLOVE SURG POLYISO LF SZ5.5 (GLOVE) ×2 IMPLANT
GOWN STRL REUS W/TWL LRG LVL3 (GOWN DISPOSABLE) ×2 IMPLANT
GOWN STRL REUS W/TWL XL LVL3 (GOWN DISPOSABLE) ×4 IMPLANT
GRASPER SUT TROCAR 14GX15 (MISCELLANEOUS) IMPLANT
HEMOSTAT SNOW SURGICEL 2X4 (HEMOSTASIS) IMPLANT
KIT BASIN OR (CUSTOM PROCEDURE TRAY) ×2 IMPLANT
KIT TURNOVER KIT A (KITS) ×2 IMPLANT
L-HOOK LAP DISP 36CM (ELECTROSURGICAL) ×2
LHOOK LAP DISP 36CM (ELECTROSURGICAL) ×1 IMPLANT
NDL INSUFFLATION 14GA 120MM (NEEDLE) IMPLANT
NEEDLE HYPO 22GX1.5 SAFETY (NEEDLE) ×2 IMPLANT
NEEDLE INSUFFLATION 14GA 120MM (NEEDLE) IMPLANT
PACK UNIVERSAL I (CUSTOM PROCEDURE TRAY) ×2 IMPLANT
POUCH SPECIMEN RETRIEVAL 10MM (ENDOMECHANICALS) ×2 IMPLANT
SCISSORS LAP 5X35 DISP (ENDOMECHANICALS) ×2 IMPLANT
SET CHOLANGIOGRAPH MIX (MISCELLANEOUS) IMPLANT
SET IRRIG TUBING LAPAROSCOPIC (IRRIGATION / IRRIGATOR) ×2 IMPLANT
SET TUBE SMOKE EVAC HIGH FLOW (TUBING) ×2 IMPLANT
SLEEVE XCEL OPT CAN 5 100 (ENDOMECHANICALS) ×4 IMPLANT
SOL ANTI FOG 6CC (MISCELLANEOUS) ×1 IMPLANT
SOLUTION ANTI FOG 6CC (MISCELLANEOUS) ×1
SUT MNCRL AB 4-0 PS2 18 (SUTURE) ×2 IMPLANT
SUT VICRYL 0 UR6 27IN ABS (SUTURE) ×3 IMPLANT
SYR 20ML LL LF (SYRINGE) IMPLANT
SYR CONTROL 10ML LL (SYRINGE) ×2 IMPLANT
TOWEL OR 17X26 10 PK STRL BLUE (TOWEL DISPOSABLE) ×2 IMPLANT
TOWEL OR NON WOVEN STRL DISP B (DISPOSABLE) IMPLANT
TROCAR BLADELESS OPT 5 100 (ENDOMECHANICALS) ×2 IMPLANT
TROCAR XCEL 12X100 BLDLESS (ENDOMECHANICALS) IMPLANT
TROCAR XCEL BLUNT TIP 100MML (ENDOMECHANICALS) IMPLANT

## 2020-03-13 NOTE — Anesthesia Postprocedure Evaluation (Signed)
Anesthesia Post Note  Patient: Paula Silva  Procedure(s) Performed: LAPAROSCOPIC CHOLECYSTECTOMY (N/A )     Patient location during evaluation: PACU Anesthesia Type: General Level of consciousness: awake and alert Pain management: pain level controlled Vital Signs Assessment: post-procedure vital signs reviewed and stable Respiratory status: spontaneous breathing, nonlabored ventilation, respiratory function stable and patient connected to nasal cannula oxygen Cardiovascular status: blood pressure returned to baseline and stable Postop Assessment: no apparent nausea or vomiting Anesthetic complications: no   No complications documented.  Last Vitals:  Vitals:   03/13/20 0718 03/13/20 1037  BP: (!) 142/96 (!) 151/87  Pulse: (!) 104 89  Resp: 16 14  Temp: 36.8 C 36.4 C  SpO2: 97%     Last Pain:  Vitals:   03/13/20 0721  TempSrc:   PainSc: 3                  Maks Cavallero

## 2020-03-13 NOTE — Op Note (Signed)
Date: 03/13/20  Patient: Paula Silva MRN: [id number]  Preoperative Diagnosis: Symptomatic cholelithiasis Postoperative Diagnosis: Same  Procedure: Laparoscopic cholecystectomy  Surgeon: Sophronia Simas, MD Assistant: Feliciana Rossetti, MD  EBL: Minimal  Anesthesia: General  Specimens: Gallbladder  Indications: Paula Silva is a 55 yo female who presented with postprandial epigastric pain. RUQ US showed a large stone in the neck of the gallbladder, as well as several subcentimeter polyps. After a discussion of the risks and benefits of surgery, she agreed to proceed with cholecystectomy.  Findings: Large stone in the neck of the gallbladder with gallbladder wall thickening, consistent with chronic cholecystitis.  Procedure details: Informed consent was obtained in the preoperative area prior to the procedure. The patient was brought to the operating room and placed on the table in the supine position. General anesthesia was induced and appropriate lines and drains were placed for intraoperative monitoring. Perioperative antibiotics were administered per SCIP guidelines. The abdomen was prepped and draped in the usual sterile fashion. A pre-procedure timeout was taken verifying patient identity, surgical site and procedure to be performed.  A small infraumbilical skin incision was made, the subcutaneous tissue was divided with cautery, and the umbilical stalk was grasped and elevated. The fascia was incised and the peritoneal cavity was directly visualized. A 11mm Hassan trocar was placed. The peritoneal cavity was inspected with no evidence of visceral or vascular injury. Three 28mm ports were placed in the right subcostal margin, all under direct visualization. The fundus of the gallbladder was grasped and retracted cephalad. There were omental adhesions to the gallbladder, which were taken down with cautery. The gallbladder itself was thickened and mildly contracted, consistent with chronic  cholecystitis. The infundibulum was retracted laterally. This retraction was difficult due to presence of a large stone impacted in the neck of the gallbladder, and a defect was inadvertently created in the gallbladder. The stone was removed through this defect to facilitate retraction. The cystic triangle was then dissected out using cautery and blunt dissection. This was a difficult dissection due to the presence of chronic inflammation, but the cystic duct and artery were able to be clearly identified and circumferentially dissected out. Once the critical view of safety was obtained, the cystic duct and cystic artery were clipped and ligated. The gallbladder was taken off the liver using cautery. The specimen was placed in an endocatch bag and removed. The surgical site was irrigated with saline until the effluent was clear. Hemostasis was achieved in the gallbladder fossa using cautery. The cystic duct and artery stumps were visually inspected and there was no evidence of bile leak or bleeding. The ports were removed and the abdomen was desufflated. The umbilical port site fascia was closed with a 0 vicryl suture. The skin at all port sites was closed with 4-0 monocryl subcuticular suture. Dermabond was applied.  The patient tolerated the procedure well with no apparent complications. All counts were correct x2 at the end of the procedure. The patient was extubated and taken to PACU in stable condition.  Sophronia Simas, MD 03/13/20 10:34 AM

## 2020-03-13 NOTE — Transfer of Care (Signed)
Immediate Anesthesia Transfer of Care Note  Patient: Paula Silva  Procedure(s) Performed: LAPAROSCOPIC CHOLECYSTECTOMY (N/A )  Patient Location: PACU  Anesthesia Type:General  Level of Consciousness: awake, alert  and oriented  Airway & Oxygen Therapy: Patient Spontanous Breathing and Patient connected to face mask oxygen  Post-op Assessment: Report given to RN and Post -op Vital signs reviewed and stable  Post vital signs: Reviewed and stable  Last Vitals:  Vitals Value Taken Time  BP 151/87 03/13/20 1036  Temp    Pulse    Resp 14 03/13/20 1036  SpO2    Vitals shown include unvalidated device data.  Last Pain:  Vitals:   03/13/20 0721  TempSrc:   PainSc: 3          Complications: No complications documented.

## 2020-03-13 NOTE — Anesthesia Preprocedure Evaluation (Addendum)
Anesthesia Evaluation  Patient identified by MRN, date of birth, ID band Patient awake    Reviewed: Allergy & Precautions, H&P , NPO status , Patient's Chart, lab work & pertinent test results, reviewed documented beta blocker date and time   Airway Mallampati: I  TM Distance: >3 FB Neck ROM: full    Dental no notable dental hx. (+) Teeth Intact, Dental Advisory Given, Caps   Pulmonary neg pulmonary ROS,    Pulmonary exam normal breath sounds clear to auscultation       Cardiovascular Exercise Tolerance: Good negative cardio ROS   Rhythm:regular Rate:Normal     Neuro/Psych negative neurological ROS  negative psych ROS   GI/Hepatic Neg liver ROS, GERD  Medicated,  Endo/Other  Morbid obesity  Renal/GU negative Renal ROS  negative genitourinary   Musculoskeletal  (+) Arthritis , Osteoarthritis,    Abdominal   Peds  Hematology negative hematology ROS (+)   Anesthesia Other Findings   Reproductive/Obstetrics negative OB ROS                            Anesthesia Physical Anesthesia Plan  ASA: II  Anesthesia Plan: General   Post-op Pain Management:    Induction: Intravenous  PONV Risk Score and Plan: 3 and Ondansetron, Dexamethasone and Midazolam  Airway Management Planned: Oral ETT  Additional Equipment: None  Intra-op Plan:   Post-operative Plan: Extubation in OR  Informed Consent: I have reviewed the patients History and Physical, chart, labs and discussed the procedure including the risks, benefits and alternatives for the proposed anesthesia with the patient or authorized representative who has indicated his/her understanding and acceptance.     Dental Advisory Given  Plan Discussed with: CRNA and Anesthesiologist  Anesthesia Plan Comments: (  )        Anesthesia Quick Evaluation

## 2020-03-13 NOTE — Discharge Instructions (Signed)
[facility] DISCHARGE INSTRUCTIONS  Activity . No heavy lifting greater than 10 pounds for 4 weeks after surgery. [place] to shower, but do not bathe or submerge incisions underwater. . Do not drive while taking narcotic pain medication.  Wound Care . Your incisions are covered with skin glue called Dermabond. This will peel off on its own over time. . You may shower and allow warm soapy water to run over your incisions. Gently pat dry. . Do not submerge your incision underwater. . Monitor your incision for any new redness, tenderness, or drainage.  When to Call us: Marland Kitchen Fever greater than 100.5 . New redness, drainage, or swelling at incision site . Severe pain, nausea, or vomiting . Jaundice (yellowing of the whites of the eyes or skin)  Follow-up You have an appointment scheduled with Dr. Freida Busman on 04/09/2020 at 9:30am. This will be at the [facility] office at [address], [address], [place], [place]. Please arrive at least 15 minutes prior to your scheduled appointment time.  For questions or concerns, please call the office at [phone].

## 2020-03-13 NOTE — Anesthesia Procedure Notes (Signed)
Procedure Name: Intubation Date/Time: 03/13/2020 9:08 AM Performed by: Cardale Dorer D, CRNA Pre-anesthesia Checklist: Patient identified, Emergency Drugs available, Suction available and Patient being monitored Patient Re-evaluated:Patient Re-evaluated prior to induction Oxygen Delivery Method: Circle system utilized Preoxygenation: Pre-oxygenation with 100% oxygen Induction Type: IV induction Ventilation: Mask ventilation without difficulty Laryngoscope Size: Mac and 4 Grade View: Grade II Tube type: Oral Number of attempts: 1 Airway Equipment and Method: Stylet Placement Confirmation: ETT inserted through vocal cords under direct vision,  positive ETCO2 and breath sounds checked- equal and bilateral Secured at: 21 cm Tube secured with: Tape Dental Injury: Teeth and Oropharynx as per pre-operative assessment

## 2020-03-13 NOTE — H&P (Signed)
Paula Silva is an 55 y.o. female.   Chief Complaint: abdominal pain HPI: Ms. Paula Silva is a 55 yo female who presented with postprandial epigastric abdominal pain. A RUQ US showed a stone in the neck of the gallbladder with several subcentimeter polyps. She presents today for cholecystectomy. COVID was negative on 2/7.  Past Medical History:  Diagnosis Date  . Arthritis    hands  . GERD (gastroesophageal reflux disease)     Past Surgical History:  Procedure Laterality Date  . cyst removed    . lipoma removal    . WISDOM TOOTH EXTRACTION      Family History  Problem Relation Age of Onset  . Stroke Mother   . Heart failure Father   . Prostate cancer Brother   . Pulmonary embolism Brother    Social History:  reports that she has never smoked. She has never used smokeless tobacco. She reports current alcohol use. She reports that she does not use drugs.  Allergies: No Known Allergies  Medications Prior to Admission  Medication Sig Dispense Refill  . Brimonidine Tartrate (LUMIFY) 0.025 % SOLN Place 1 drop into both eyes daily as needed (redness).    . cetirizine (ZYRTEC) 10 MG tablet Take 10 mg by mouth daily as needed for allergies.    Marland Kitchen diclofenac Sodium (VOLTAREN) 1 % GEL Apply 1 application topically 4 (four) times daily as needed (pain).    . famotidine (PEPCID) 20 MG tablet Take 20 mg by mouth daily as needed for heartburn or indigestion.    Marland Kitchen ibuprofen (ADVIL) 200 MG tablet Take 400-600 mg by mouth every 6 (six) hours as needed for headache or moderate pain.    . naproxen sodium (ALEVE) 220 MG tablet Take 220-440 mg by mouth daily as needed (pain).      Results for orders placed or performed during the hospital encounter of 03/13/20 (from the past 48 hour(s))  Pregnancy, urine per protocol     Status: None   Collection Time: 03/13/20  7:07 AM  Result Value Ref Range   Preg Test, Ur NEGATIVE NEGATIVE    Comment:        THE SENSITIVITY OF THIS METHODOLOGY IS >20  mIU/mL. Performed at [facility], [address], [place], [place] [address]    No results found.  Review of Systems  Constitutional: Negative for chills and fever.  Respiratory: Negative for shortness of breath and stridor.   Gastrointestinal: Positive for abdominal pain.  Allergic/Immunologic: Negative for immunocompromised state.  Neurological: Negative for facial asymmetry and speech difficulty.  Psychiatric/Behavioral: Negative for agitation and confusion.    Blood pressure (!) 142/96, pulse (!) 104, temperature 98.2 F (36.8 C), temperature source Oral, resp. rate 16, weight 100 kg, SpO2 97 %. Physical Exam Constitutional:      Appearance: Normal appearance.  HENT:     Head: Normocephalic and atraumatic.  Eyes:     General: No scleral icterus.    Conjunctiva/sclera: Conjunctivae normal.  Pulmonary:     Effort: Pulmonary effort is normal. No respiratory distress.     Breath sounds: No stridor.  Abdominal:     General: There is no distension.     Palpations: Abdomen is soft.     Tenderness: There is no abdominal tenderness.  Musculoskeletal:        General: Normal range of motion.     Cervical back: Normal range of motion.  Skin:    General: Skin is warm and dry.  Coloration: Skin is not jaundiced.  Neurological:     General: No focal deficit present.     Mental Status: She is alert and oriented to person, place, and time.  Psychiatric:        Mood and Affect: Mood normal.        Behavior: Behavior normal.        Thought Content: Thought content normal.      Assessment/Plan 55 yo female with symptomatic cholelithiasis. Proceed to OR for lap cholecystectomy. Plan for discharge home from PACU. Risks and benefits discussed, informed consent obtained.  Fritzi Mandes, MD 03/13/2020, 8:32 AM

## 2020-03-14 ENCOUNTER — Encounter (HOSPITAL_COMMUNITY): Payer: Self-pay | Admitting: Surgery

## 2020-03-14 LAB — SURGICAL PATHOLOGY

## 2021-09-04 IMAGING — US US ABDOMEN LIMITED
1 series · 14 of 25 positions shown · non-contrast
Comparison: None.

CLINICAL DATA: Epigastric pain for several days, diarrhea

EXAM:
ULTRASOUND ABDOMEN LIMITED RIGHT UPPER QUADRANT

[Series 1: us abdomen limited · 0.23mm/px · 14 of 51 slices shown]
[im 1/51]
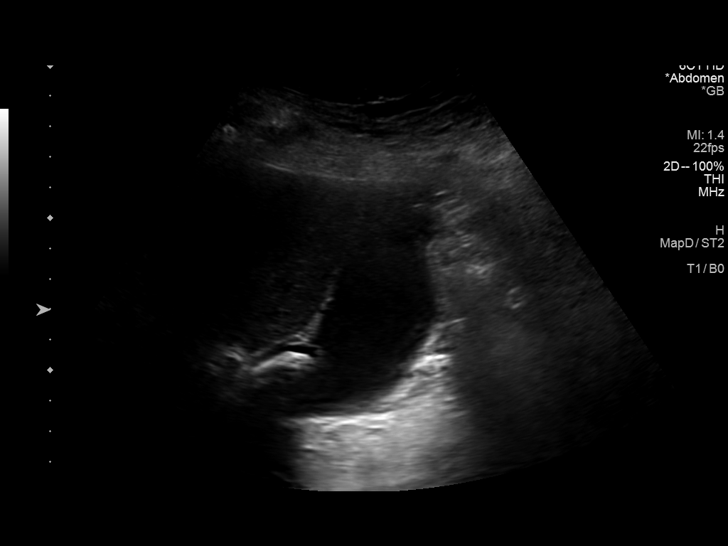
[im 5/51]
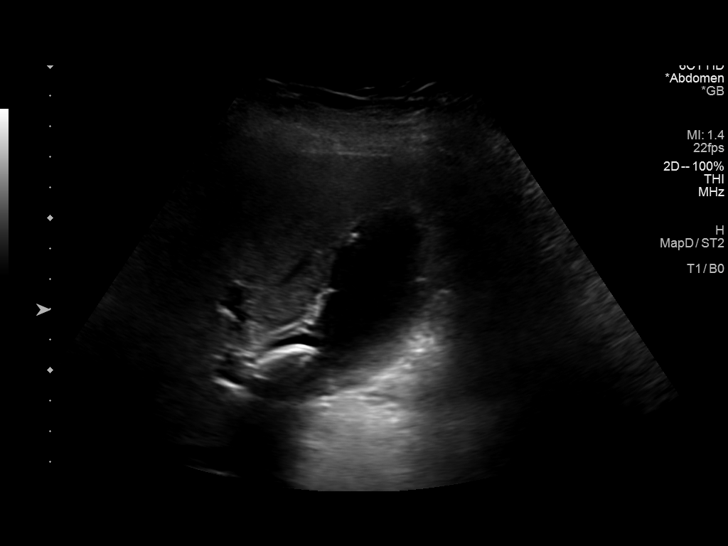
[im 9/51]
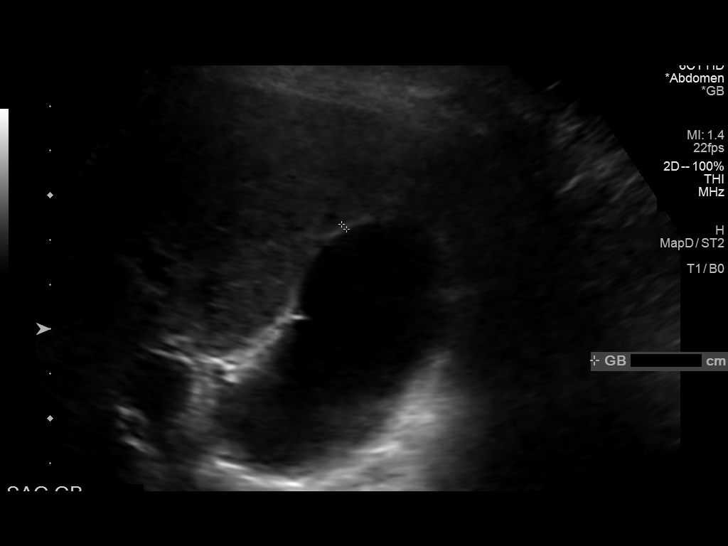
[im 13/51]
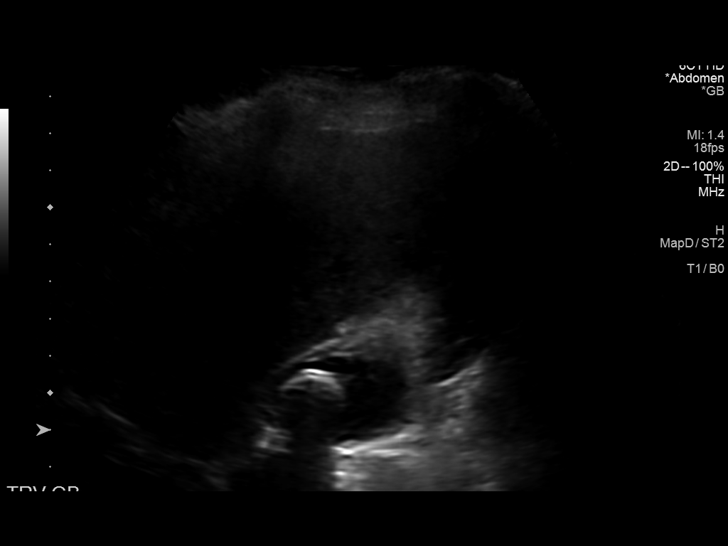
[im 17/51]
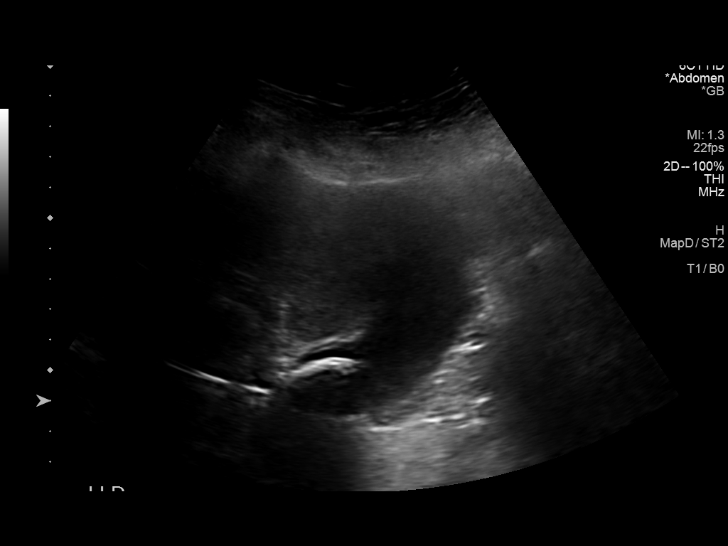
[im 19/51]
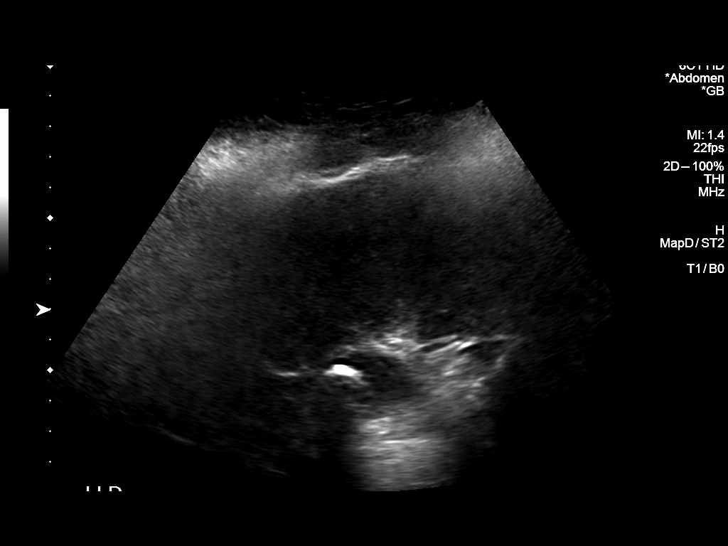
[im 23/51]
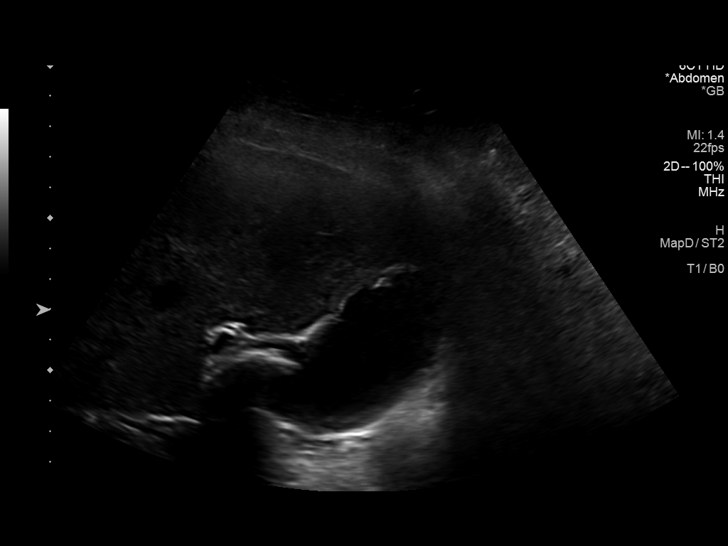
[im 28/51]
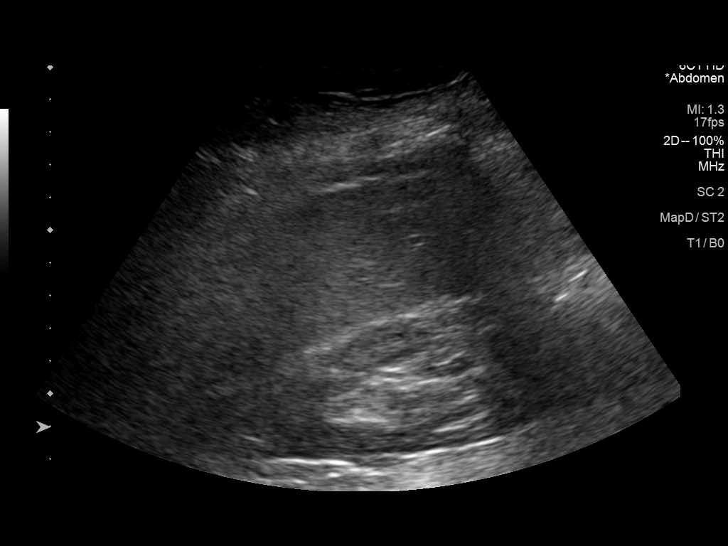
[im 32/51]
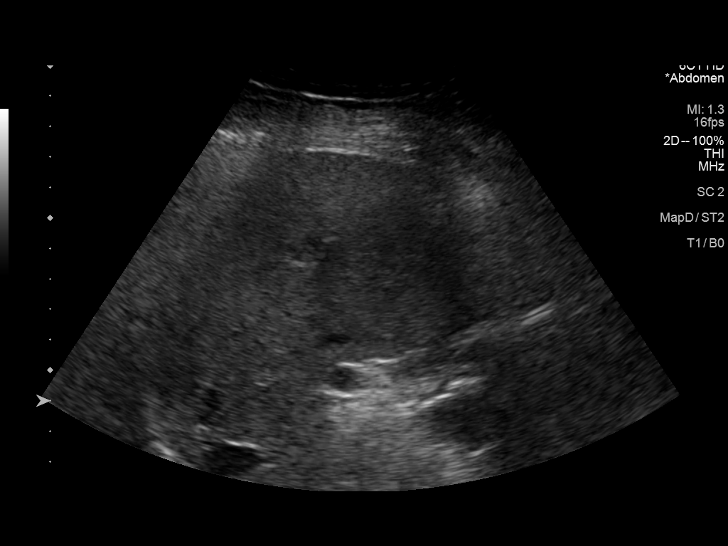
[im 34/51]
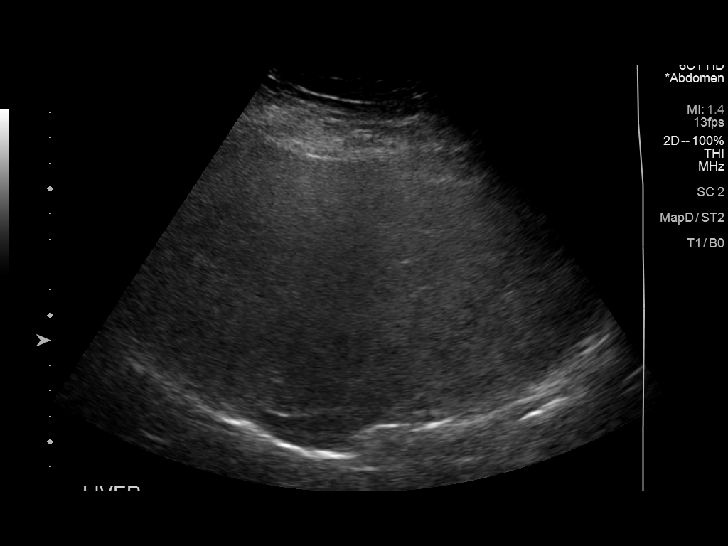
[im 38/51]
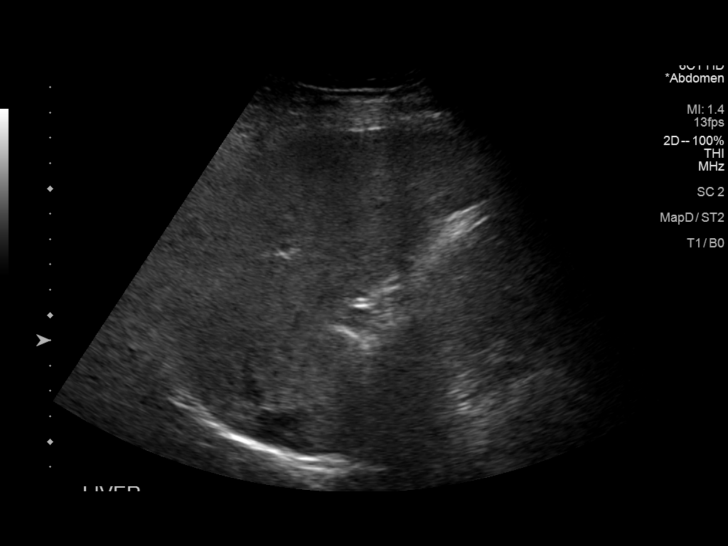
[im 42/51]
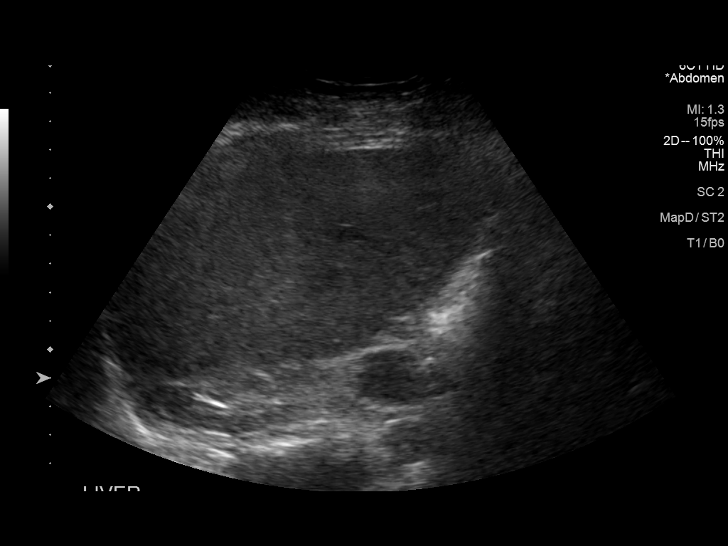
[im 46/51]
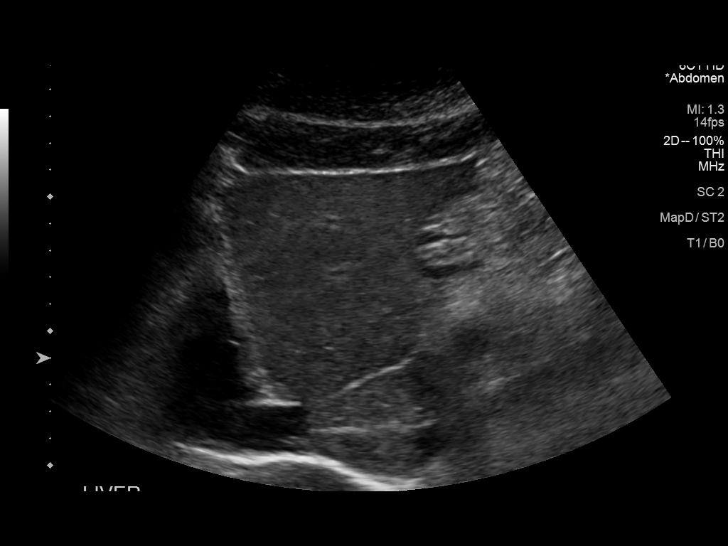
[im 51/51]
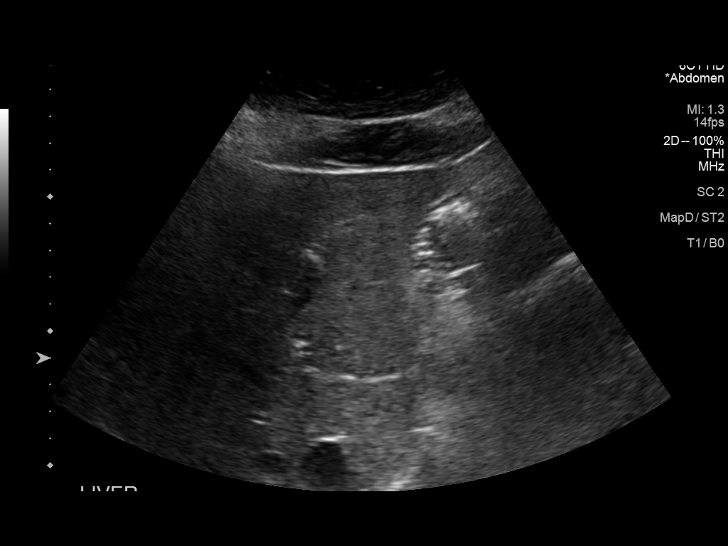

[14 of 25 positions shown; findings below may reference images not displayed]

FINDINGS: Gallbladder:

Shadowing non mobile 19 mm gallstone is identified within the
gallbladder neck. Small gallbladder polyps versus cholesterol oasis
are noted elsewhere in the gallbladder. No gallbladder wall
thickening or pericholecystic fluid.

Common bile duct:

Diameter: 4 mm

Liver:

Coarsened liver echotexture is nonspecific. No focal liver
abnormality. Portal vein is patent on color Doppler imaging with
normal direction of blood flow towards the liver.

Other: None.
IMPRESSION: 1. Nonmobile 19 mm gallstone within the gallbladder neck. No acute
cholecystitis.
2. Likely small gallbladder polyps.
3. Heterogeneous liver echotexture, nonspecific. This could reflect
hepatic steatosis.
